# Patient Record
Sex: Female | Born: 1975 | Race: Black or African American | Hispanic: No | Marital: Single | Smoking: Never smoker
Health system: Southern US, Community
[De-identification: ages and names within clinical notes are randomized; demographics above are authoritative.]

---

## 2014-12-08 ENCOUNTER — Encounter (HOSPITAL_COMMUNITY): Payer: Self-pay | Admitting: *Deleted

## 2014-12-08 ENCOUNTER — Emergency Department (INDEPENDENT_AMBULATORY_CARE_PROVIDER_SITE_OTHER)
Admission: EM | Admit: 2014-12-08 | Discharge: 2014-12-08 | Disposition: A | Discharge status: Home / Self Care | Payer: Self-pay | Source: Home / Self Care | Attending: Family Medicine | Admitting: Family Medicine

## 2014-12-08 DIAGNOSIS — J069 Acute upper respiratory infection, unspecified: Secondary | ICD-10-CM

## 2014-12-08 MED ORDER — GUAIFENESIN-CODEINE 100-10 MG/5ML PO SYRP: 10.0000 mL | ORAL_SOLUTION | Freq: Four times a day (QID) | ORAL | Status: AC | PRN

## 2014-12-08 MED ORDER — IPRATROPIUM BROMIDE 0.06 % NA SOLN: 2.0000 | Freq: Four times a day (QID) | NASAL | Status: AC

## 2014-12-08 NOTE — ED Notes (Signed)
Pt  Reports  Symptoms  Of  Congestion   -  Cough         With  sorethroat  And   Nasal  Congestion       For  sev  Days             Pt     Child  Is  Ill  As   Well  With  Similar  Symptoms     Pt  Sitting  Upright on  Exam table  Speaking  In  Complete  sentances

## 2014-12-08 NOTE — Discharge Instructions (Signed)
Drink plenty of fluids as discussed, use medicine as prescribed, and mucinex or delsym for cough. Return or see your doctor if further problems °

## 2014-12-08 NOTE — ED Provider Notes (Signed)
CSN: [id number]     Arrival date & time 12/08/14  1006 History   First MD Initiated Contact with Patient 12/08/14 1015     Chief Complaint  Patient presents with  . URI   (Consider location/radiation/quality/duration/timing/severity/associated sxs/prior Treatment) Patient is a 38 y.o. female presenting with URI. The history is provided by the patient.  URI Presenting symptoms: congestion, cough and rhinorrhea   Presenting symptoms: no fever and no sore throat   Severity:  Mild Onset quality:  Gradual Duration:  3 days Progression:  Unchanged Chronicity:  New Associated symptoms: no wheezing   Risk factors: sick contacts     History reviewed. No pertinent past medical history. History reviewed. No pertinent past surgical history. History reviewed. No pertinent family history. History  Substance Use Topics  . Smoking status: Never Smoker   . Smokeless tobacco: Not on file  . Alcohol Use: No   OB History    No data available     Review of Systems  Constitutional: Negative.  Negative for fever.  HENT: Positive for congestion, postnasal drip and rhinorrhea. Negative for sore throat.   Respiratory: Positive for cough. Negative for shortness of breath and wheezing.   Gastrointestinal: Negative.     Allergies  Review of patient's allergies indicates no known allergies.  Home Medications   Prior to Admission medications   Medication Sig Start Date End Date Taking? Authorizing Provider  guaiFENesin-codeine (ROBITUSSIN AC) 100-10 MG/5ML syrup Take 10 mLs by mouth 4 (four) times daily as needed for cough. 12/08/14   Linna HoffJames D Rashida Ladouceur, MD  ipratropium (ATROVENT) 0.06 % nasal spray Place 2 sprays into both nostrils 4 (four) times daily. 12/08/14   Linna HoffJames D Inette Doubrava, MD   BP 116/78 mmHg  Pulse 88  Temp(Src) 98.7 F (37.1 C) (Oral)  Resp 16  SpO2 95%  LMP 11/24/2014 Physical Exam  Constitutional: She is oriented to person, place, and time. She appears well-developed and  well-nourished.  HENT:  Head: Normocephalic.  Right Ear: External ear normal.  Left Ear: External ear normal.  Mouth/Throat: Oropharynx is clear and moist.  Neck: Normal range of motion. Neck supple.  Cardiovascular: Normal rate, regular rhythm, normal heart sounds and intact distal pulses.   Pulmonary/Chest: Effort normal and breath sounds normal.  Lymphadenopathy:    She has no cervical adenopathy.  Neurological: She is alert and oriented to person, place, and time.  Skin: Skin is warm and dry.  Nursing note and vitals reviewed.   ED Course  Procedures (including critical care time) Labs Review Labs Reviewed - No data to display  Imaging Review No results found.   MDM   1. URI (upper respiratory infection)        Linna HoffJames D Cintia Gleed, MD 12/08/14 1144

## 2015-02-23 ENCOUNTER — Emergency Department (HOSPITAL_COMMUNITY)
Admission: EM | Admit: 2015-02-23 | Discharge: 2015-02-24 | Disposition: A | Discharge status: Home / Self Care | Payer: No Typology Code available for payment source | Attending: Emergency Medicine | Admitting: Emergency Medicine

## 2015-02-23 ENCOUNTER — Encounter (HOSPITAL_COMMUNITY): Payer: Self-pay | Admitting: Emergency Medicine

## 2015-02-23 ENCOUNTER — Emergency Department (HOSPITAL_COMMUNITY): Payer: No Typology Code available for payment source

## 2015-02-23 DIAGNOSIS — S3992XA Unspecified injury of lower back, initial encounter: Secondary | ICD-10-CM | POA: Insufficient documentation

## 2015-02-23 DIAGNOSIS — Y998 Other external cause status: Secondary | ICD-10-CM | POA: Insufficient documentation

## 2015-02-23 DIAGNOSIS — Y92410 Unspecified street and highway as the place of occurrence of the external cause: Secondary | ICD-10-CM | POA: Insufficient documentation

## 2015-02-23 DIAGNOSIS — Y939 Activity, unspecified: Secondary | ICD-10-CM | POA: Diagnosis not present

## 2015-02-23 DIAGNOSIS — Z79899 Other long term (current) drug therapy: Secondary | ICD-10-CM | POA: Diagnosis not present

## 2015-02-23 MED ORDER — METHOCARBAMOL 500 MG PO TABS: 500.0000 mg | ORAL_TABLET | Freq: Two times a day (BID) | ORAL | Status: AC

## 2015-02-23 MED ORDER — NAPROXEN 500 MG PO TABS: 500.0000 mg | ORAL_TABLET | Freq: Two times a day (BID) | ORAL | Status: AC

## 2015-02-23 MED ORDER — HYDROCODONE-ACETAMINOPHEN 5-325 MG PO TABS: 1.0000 | ORAL_TABLET | Freq: Four times a day (QID) | ORAL | Status: AC | PRN

## 2015-02-23 NOTE — ED Notes (Signed)
Called X1 with no Answer.
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
## 2016-10-10 ENCOUNTER — Encounter (HOSPITAL_COMMUNITY): Payer: Self-pay | Admitting: Family Medicine

## 2016-10-10 ENCOUNTER — Emergency Department (HOSPITAL_COMMUNITY): Admission: EM | Admit: 2016-10-10 | Discharge: 2016-10-10 | Discharge status: Against Medical Advice | Payer: Self-pay

## 2016-10-10 DIAGNOSIS — Y9389 Activity, other specified: Secondary | ICD-10-CM | POA: Diagnosis not present

## 2016-10-10 DIAGNOSIS — W228XXA Striking against or struck by other objects, initial encounter: Secondary | ICD-10-CM | POA: Insufficient documentation

## 2016-10-10 DIAGNOSIS — S0502XA Injury of conjunctiva and corneal abrasion without foreign body, left eye, initial encounter: Secondary | ICD-10-CM | POA: Diagnosis not present

## 2016-10-10 DIAGNOSIS — Y999 Unspecified external cause status: Secondary | ICD-10-CM | POA: Diagnosis not present

## 2016-10-10 DIAGNOSIS — Y929 Unspecified place or not applicable: Secondary | ICD-10-CM | POA: Insufficient documentation

## 2016-10-10 DIAGNOSIS — R220 Localized swelling, mass and lump, head: Secondary | ICD-10-CM | POA: Diagnosis present

## 2016-10-10 DIAGNOSIS — Z791 Long term (current) use of non-steroidal anti-inflammatories (NSAID): Secondary | ICD-10-CM | POA: Insufficient documentation

## 2016-10-10 DIAGNOSIS — Z79899 Other long term (current) drug therapy: Secondary | ICD-10-CM | POA: Diagnosis not present

## 2016-10-10 NOTE — ED Notes (Signed)
Called in waiting room and unable to find to be triaged.
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
## 2016-10-11 ENCOUNTER — Emergency Department (HOSPITAL_COMMUNITY)
Admission: EM | Admit: 2016-10-11 | Discharge: 2016-10-11 | Disposition: A | Discharge status: Home / Self Care | Payer: Medicaid Other | Attending: Emergency Medicine | Admitting: Emergency Medicine

## 2016-10-11 DIAGNOSIS — S0502XA Injury of conjunctiva and corneal abrasion without foreign body, left eye, initial encounter: Secondary | ICD-10-CM

## 2016-10-11 MED ORDER — PROPARACAINE HCL 0.5 % OP SOLN
2.0000 [drp] | Freq: Once | OPHTHALMIC | Status: AC
  Administered 2016-10-11: 2 [drp] via OPHTHALMIC
  Filled 2016-10-11: qty 15

## 2016-10-11 MED ORDER — TOBRAMYCIN 0.3 % OP SOLN
2.0000 [drp] | OPHTHALMIC | Status: DC
  Administered 2016-10-11: 2 [drp] via OPHTHALMIC
  Filled 2016-10-11: qty 5

## 2016-10-11 MED ORDER — FLUORESCEIN SODIUM 1 MG OP STRP
1.0000 | ORAL_STRIP | Freq: Once | OPHTHALMIC | Status: AC
  Administered 2016-10-11: 1 via OPHTHALMIC
  Filled 2016-10-11: qty 1

## 2016-10-11 NOTE — ED Provider Notes (Signed)
[facility]-EMERGENCY DEPT Provider Note   CSN: [id number] Arrival date & time: 10/10/16  2155  By signing my name below, I, Soijett Blue, attest that this documentation has been prepared under the direction and in the presence of Elpidio Anis, PA-C Electronically Signed: Soijett Blue, ED Scribe. 10/11/16. 1:10 AM   History   Chief Complaint Chief Complaint  Patient presents with  . Facial Swelling    HPI Elizabeth Yates is a 40 y.o. female who presents to the Emergency Department complaining of left sided facial swelling onset this morning. Pt notes that this morning she was putting on her clothes when her thumb grazed over her left eye. Pt is having gradually worsening associated symptoms of left eye swelling, left eye pain, and left eye redness. She notes that she has tried OTC eye drops with no relief of her symptoms. She denies fever, chills, and any other symptoms.     The history is provided by the patient. No language interpreter was used.    History reviewed. No pertinent past medical history.  There are no active problems to display for this patient.   History reviewed. No pertinent surgical history.  OB History    No data available       Home Medications    Prior to Admission medications   Medication Sig Start Date End Date Taking? Authorizing Provider  guaiFENesin-codeine (ROBITUSSIN AC) 100-10 MG/5ML syrup Take 10 mLs by mouth 4 (four) times daily as needed for cough. 12/08/14   Linna Hoff, MD  HYDROcodone-acetaminophen (NORCO/VICODIN) 5-325 MG per tablet Take 1 tablet by mouth every 6 (six) hours as needed. 02/23/15   Hanna Patel-Mills, PA-C  ipratropium (ATROVENT) 0.06 % nasal spray Place 2 sprays into both nostrils 4 (four) times daily. 12/08/14   Linna Hoff, MD  methocarbamol (ROBAXIN) 500 MG tablet Take 1 tablet (500 mg total) by mouth 2 (two) times daily. 02/23/15   Hanna Patel-Mills, PA-C  naproxen (NAPROSYN) 500 MG tablet Take 1 tablet (500 mg total) by  mouth 2 (two) times daily. 02/23/15   Catha Gosselin, PA-C    Family History History reviewed. No pertinent family history.  Social History Social History  Substance Use Topics  . Smoking status: Never Smoker  . Smokeless tobacco: Never Used  . Alcohol use No     Allergies   Review of patient's allergies indicates no known allergies.   Review of Systems Review of Systems  Constitutional: Negative for chills and fever.  HENT: Positive for facial swelling (left sided).   Eyes: Positive for pain and redness.  Gastrointestinal: Negative for nausea.  Neurological: Negative for headaches.     Physical Exam Updated Vital Signs BP 116/77 (BP Location: Left Arm)   Pulse 68   Temp 98.1 F (36.7 C) (Oral)   Resp 20   Ht 5\' 9"  (1.753 m)   Wt 235 lb (106.6 kg)   LMP 09/27/2016 (Approximate)   SpO2 93%   BMI 34.70 kg/m   Physical Exam  Constitutional: She is oriented to person, place, and time. She appears well-developed and well-nourished. No distress.  HENT:  Head: Normocephalic and atraumatic.  Eyes: EOM are normal.  Left eye redness. No purulent drainage. No conjunctival swelling or chemosis. No hyphema. There is no periorbital swelling or tenderness. There is small corneal abrasion at the 3:00 o'clock position without ulceration.  Neck: Neck supple.  Pulmonary/Chest: Effort normal. No respiratory distress.  Abdominal: She exhibits no distension.  Musculoskeletal: Normal range of motion.  Neurological: She is alert and oriented to person, place, and time.  Skin: Skin is warm and dry.  Psychiatric: She has a normal mood and affect.  Nursing note and vitals reviewed.    ED Treatments / Results  DIAGNOSTIC STUDIES: Oxygen Saturation is 93% on RA, low by my interpretation.    COORDINATION OF CARE: 1:02 AM Discussed treatment plan with pt at bedside which and pt agreed to plan.   Procedures Procedures (including critical care time)  Medications Ordered in  ED Medications - No data to display   Initial Impression / Assessment and Plan / ED Course  I have reviewed the triage vital signs and the nursing notes.   Clinical Course    1. Corneal Abrasion, left  Small abrasion requiring antibiotic treatment. Anticipate uncomplicated resolution.  Final Clinical Impressions(s) / ED Diagnoses   Final diagnoses:  None    New Prescriptions New Prescriptions   No medications on file    I personally performed the services described in this documentation, which was scribed in my presence. The recorded information has been reviewed and is accurate.      Elpidio AnisShari Carron Mcmurry, PA-C 10/11/16 0206    Gilda Creasehristopher J Pollina, MD 10/12/16 [phone]

## 2019-07-26 ENCOUNTER — Encounter: Payer: Self-pay | Admitting: Emergency Medicine

## 2019-07-26 ENCOUNTER — Emergency Department
Admission: EM | Admit: 2019-07-26 | Discharge: 2019-07-26 | Disposition: A | Discharge status: Home / Self Care | Payer: Medicaid Other | Attending: Emergency Medicine | Admitting: Emergency Medicine

## 2019-07-26 ENCOUNTER — Other Ambulatory Visit: Payer: Self-pay

## 2019-07-26 DIAGNOSIS — Z139 Encounter for screening, unspecified: Secondary | ICD-10-CM | POA: Diagnosis present

## 2019-07-26 DIAGNOSIS — Z20828 Contact with and (suspected) exposure to other viral communicable diseases: Secondary | ICD-10-CM | POA: Diagnosis not present

## 2019-07-26 LAB — SARS CORONAVIRUS 2 (TAT 6-24 HRS): SARS Coronavirus 2: NEGATIVE

## 2019-07-26 NOTE — ED Provider Notes (Signed)
[facility] Emergency Department Provider Note       Time seen: ----------------------------------------- 4:56 AM on 07/26/2019 -----------------------------------------   I have reviewed the triage vital signs and the nursing notes.  HISTORY   Chief Complaint covid test    HPI Elizabeth Yates is a 43 y.o. female with no significant past medical history who presents to the ED for COVID testing.  She denies any symptoms, was told to come here by her work and get tested.  History reviewed. No pertinent past medical history.  There are no active problems to display for this patient.   Past Surgical History:  Procedure Laterality Date  . CESAREAN SECTION      Allergies Shrimp [shellfish allergy]  Social History Social History   Tobacco Use  . Smoking status: Never Smoker  . Smokeless tobacco: Never Used  Substance Use Topics  . Alcohol use: No  . Drug use: No    Review of Systems Constitutional: Negative for fever. Cardiovascular: Negative for chest pain. Respiratory: Negative for shortness of breath. Gastrointestinal: Negative for abdominal pain, vomiting and diarrhea. Musculoskeletal: Negative for back pain. Skin: Negative for rash. Neurological: Negative for headaches, focal weakness or numbness.  All systems negative/normal/unremarkable except as stated in the HPI  ____________________________________________   PHYSICAL EXAM:  VITAL SIGNS: ED Triage Vitals [07/26/19 0145]  Enc Vitals Group     BP 117/72     Pulse Rate 81     Resp 18     Temp 98.4 F (36.9 C)     Temp Source Oral     SpO2 96 %     Weight 213 lb 14.4 oz (97 kg)     Height      Head Circumference      Peak Flow      Pain Score 0     Pain Loc      Pain Edu?      Excl. in [place]?     Constitutional: Alert and oriented. Well appearing and in no distress. Eyes: Conjunctivae are normal. Normal extraocular movements. Cardiovascular: Normal rate, regular rhythm.  No murmurs, rubs, or gallops. Respiratory: Normal respiratory effort without tachypnea nor retractions. Breath sounds are clear and equal bilaterally. No wheezes/rales/rhonchi. Gastrointestinal: Soft and nontender. Normal bowel sounds Musculoskeletal: Nontender with normal range of motion in extremities. No lower extremity tenderness nor edema. Neurologic:  Normal speech and language. No gross focal neurologic deficits are appreciated.  Skin:  Skin is warm, dry and intact. No rash noted. Psychiatric: Mood and affect are normal. Speech and behavior are normal.  ____________________________________________  ED COURSE:  As part of my medical decision making, I reviewed the following data within the [facility] History obtained from family if available, nursing notes, old chart and ekg, as well as notes from prior ED visits. Patient presented for COVID testing, we will assess with labs and imaging as indicated at this time.   Procedures  Sheana Bir was evaluated in Emergency Department on 07/26/2019 for the symptoms described in the history of present illness. She was evaluated in the context of the global COVID-19 pandemic, which necessitated consideration that the patient might be at risk for infection with the SARS-CoV-2 virus that causes COVID-19. Institutional protocols and algorithms that pertain to the evaluation of patients at risk for COVID-19 are in a state of rapid change based on information released by regulatory bodies including the CDC and federal and state organizations. These policies and algorithms were followed during  the patient's care in the ED.  ____________________________________________   LABS (pertinent positives/negatives)  Labs Reviewed  NOVEL CORONAVIRUS, NAA (HOSPITAL ORDER, SEND-OUT TO REF LAB)  ____________________________________________   DIFFERENTIAL DIAGNOSIS   COVID testing, medical screening exam  FINAL ASSESSMENT AND PLAN  COVID  testing, medical screening exam   Plan: The patient had presented for COVID testing.  Out of courtesy we are providing this test with the patient, have advised that in the future she needs to seek testing in an outpatient setting.   Ulice DashJohnathan E Nisa Decaire, MD    Note: This note was generated in part or whole with voice recognition software. Voice recognition is usually quite accurate but there are transcription errors that can and very often do occur. I apologize for any typographical errors that were not detected and corrected.     Emily FilbertWilliams, Talmadge Ganas E, MD 07/26/19 [phone]

## 2019-07-26 NOTE — ED Triage Notes (Signed)
Patient states that she needs to be covid tested for work. Patient denies any symptoms.

## 2019-07-26 NOTE — ED Notes (Signed)
Pt here with daughters. Pt states she needs a COVID-19 test screening for work. Pt is asymtomatic.
